# Patient Record
Sex: Male | Born: 1960 | Race: White | Hispanic: No | Marital: Married | Smoking: Current every day smoker
Health system: Southern US, Community
[De-identification: ages and names within clinical notes are randomized; demographics above are authoritative.]

## PROBLEM LIST (undated history)

## (undated) DIAGNOSIS — R5382 Chronic fatigue, unspecified: Secondary | ICD-10-CM

## (undated) DIAGNOSIS — M797 Fibromyalgia: Secondary | ICD-10-CM

## (undated) DIAGNOSIS — M545 Low back pain, unspecified: Secondary | ICD-10-CM

## (undated) DIAGNOSIS — G9332 Myalgic encephalomyelitis/chronic fatigue syndrome: Secondary | ICD-10-CM

## (undated) DIAGNOSIS — F900 Attention-deficit hyperactivity disorder, predominantly inattentive type: Secondary | ICD-10-CM

## (undated) HISTORY — DX: Chronic fatigue, unspecified: R53.82

## (undated) HISTORY — DX: Attention-deficit hyperactivity disorder, predominantly inattentive type: F90.0

## (undated) HISTORY — DX: Low back pain: M54.5

## (undated) HISTORY — PX: HAND SURGERY: SHX662

## (undated) HISTORY — DX: Fibromyalgia: M79.7

## (undated) HISTORY — DX: Myalgic encephalomyelitis/chronic fatigue syndrome: G93.32

## (undated) HISTORY — DX: Low back pain, unspecified: M54.50

---

## 2008-10-17 ENCOUNTER — Ambulatory Visit: Payer: Self-pay | Admitting: Specialist

## 2009-12-19 ENCOUNTER — Ambulatory Visit: Payer: Self-pay | Admitting: Pain Medicine

## 2010-01-01 ENCOUNTER — Ambulatory Visit: Payer: Self-pay | Admitting: Pain Medicine

## 2012-08-09 ENCOUNTER — Ambulatory Visit: Payer: Self-pay

## 2014-08-29 ENCOUNTER — Telehealth: Payer: Self-pay | Admitting: Family Medicine

## 2014-08-29 DIAGNOSIS — F900 Attention-deficit hyperactivity disorder, predominantly inattentive type: Secondary | ICD-10-CM

## 2014-08-29 DIAGNOSIS — M797 Fibromyalgia: Secondary | ICD-10-CM

## 2014-08-29 NOTE — Telephone Encounter (Signed)
PT NEEDS REF ON ALL HIS MEDS. HE COULD NOT GET AN APPT TILL July 11 BUT WILL BE OUT OF ALL MEDS IN A FEW DAYS. PHARM IS [facility] ON [address]

## 2014-08-31 DIAGNOSIS — F900 Attention-deficit hyperactivity disorder, predominantly inattentive type: Secondary | ICD-10-CM | POA: Insufficient documentation

## 2014-08-31 DIAGNOSIS — M797 Fibromyalgia: Secondary | ICD-10-CM | POA: Insufficient documentation

## 2014-08-31 MED ORDER — PREGABALIN 150 MG PO CAPS: 150.0000 mg | ORAL_CAPSULE | Freq: Every morning | ORAL | Status: DC

## 2014-08-31 MED ORDER — PREGABALIN 225 MG PO CAPS: 225.0000 mg | ORAL_CAPSULE | Freq: Every day | ORAL | Status: DC

## 2014-08-31 MED ORDER — DULOXETINE HCL 60 MG PO CPEP: 60.0000 mg | ORAL_CAPSULE | Freq: Every day | ORAL | Status: DC

## 2014-08-31 MED ORDER — BUPROPION HCL ER (XL) 300 MG PO TB24: 300.0000 mg | ORAL_TABLET | Freq: Every day | ORAL | Status: DC

## 2014-08-31 MED ORDER — AMPHETAMINE-DEXTROAMPHETAMINE 20 MG PO TABS: 20.0000 mg | ORAL_TABLET | Freq: Three times a day (TID) | ORAL | Status: DC

## 2014-08-31 NOTE — Telephone Encounter (Signed)
Do you want to refill these?

## 2014-08-31 NOTE — Telephone Encounter (Signed)
Patient has appointment on July 11. We will prescribe a 10 day supply of all his medications and follow-up at his office visit appointment

## 2014-09-10 ENCOUNTER — Ambulatory Visit: Payer: BLUE CROSS/BLUE SHIELD | Admitting: Family Medicine

## 2014-09-17 ENCOUNTER — Encounter: Payer: Self-pay | Admitting: Family Medicine

## 2014-09-17 ENCOUNTER — Ambulatory Visit (INDEPENDENT_AMBULATORY_CARE_PROVIDER_SITE_OTHER): Payer: BLUE CROSS/BLUE SHIELD | Admitting: Family Medicine

## 2014-09-17 VITALS — BP 132/70 | HR 75 | Temp 97.9°F | Resp 17 | Ht 64.0 in | Wt 169.2 lb

## 2014-09-17 DIAGNOSIS — M797 Fibromyalgia: Secondary | ICD-10-CM | POA: Insufficient documentation

## 2014-09-17 DIAGNOSIS — F900 Attention-deficit hyperactivity disorder, predominantly inattentive type: Secondary | ICD-10-CM

## 2014-09-17 DIAGNOSIS — G9332 Myalgic encephalomyelitis/chronic fatigue syndrome: Secondary | ICD-10-CM | POA: Insufficient documentation

## 2014-09-17 DIAGNOSIS — R5382 Chronic fatigue, unspecified: Secondary | ICD-10-CM

## 2014-09-17 MED ORDER — AMPHETAMINE-DEXTROAMPHETAMINE 20 MG PO TABS: 20.0000 mg | ORAL_TABLET | Freq: Three times a day (TID) | ORAL | Status: DC

## 2014-09-17 MED ORDER — DULOXETINE HCL 60 MG PO CPEP: 60.0000 mg | ORAL_CAPSULE | Freq: Every day | ORAL | Status: AC

## 2014-09-17 MED ORDER — PREGABALIN 150 MG PO CAPS: 150.0000 mg | ORAL_CAPSULE | Freq: Every morning | ORAL | Status: AC

## 2014-09-17 MED ORDER — BUPROPION HCL ER (XL) 300 MG PO TB24: 300.0000 mg | ORAL_TABLET | Freq: Every day | ORAL | Status: AC

## 2014-09-17 MED ORDER — PREGABALIN 225 MG PO CAPS: 225.0000 mg | ORAL_CAPSULE | Freq: Every day | ORAL | Status: AC

## 2014-09-17 NOTE — Progress Notes (Signed)
Name: Miguel PilesWilliam Freeman   MRN: [id number]    DOB: 11-16-1960   Date:09/17/2014       Progress Note  Subjective  Chief Complaint  Chief Complaint  Patient presents with  . Medication Refill    HPI Attention Deficit Disorder Symptoms include difficulty with attention, staying on a task (which often leads to leaving a task unfinished), and forgetfulness. Symptoms relieved with Adderall 20 mg three times daily. No side effects reported.  Fibromyalgia Symptoms include diffuse widespread intense pain along with sharp intermittent localized pains such as pain in ear or toe or anywhere. Symptoms responsive to Cymbalta and Lyrica respectively (widespread and localized pain). In addition, he takes Wellbutrin XL Past Medical History  Diagnosis Date  . Chronic fatigue fibromyalgia syndrome   . ADD (attention deficit hyperactivity disorder, inattentive type)   . Low back pain     Past Surgical History  Procedure Laterality Date  . Hand surgery Right     Family History  Problem Relation Age of Onset  . Cancer Mother   . Cancer Father   . Cancer Sister     History   Social History  . Marital Status: Married    Spouse Name: N/A  . Number of Children: N/A  . Years of Education: N/A   Occupational History  . Not on file.   Social History Main Topics  . Smoking status: Current Every Day Smoker -- 0.00 packs/day for 30 years    Types: Pipe  . Smokeless tobacco: Never Used  . Alcohol Use: 0.0 oz/week    0 Standard drinks or equivalent per week     Comment: occasional  . Drug Use: No  . Sexual Activity: Not on file   Other Topics Concern  . Not on file   Social History Narrative  . No narrative on file     Current outpatient prescriptions:  .  amphetamine-dextroamphetamine (ADDERALL) 20 MG tablet, Take 1 tablet (20 mg total) by mouth 3 (three) times daily., Disp: 30 tablet, Rfl: 0 .  buPROPion (WELLBUTRIN XL) 300 MG 24 hr tablet, Take 1 tablet (300 mg total) by mouth  daily., Disp: 10 tablet, Rfl: 0 .  DULoxetine (CYMBALTA) 60 MG capsule, Take 1 capsule (60 mg total) by mouth daily., Disp: 10 capsule, Rfl: 0 .  pregabalin (LYRICA) 150 MG capsule, Take 1 capsule (150 mg total) by mouth every morning., Disp: 10 capsule, Rfl: 0 .  pregabalin (LYRICA) 225 MG capsule, Take 1 capsule (225 mg total) by mouth at bedtime., Disp: 10 capsule, Rfl: 0  No Known Allergies   Review of Systems  Musculoskeletal: Positive for myalgias and back pain.  Psychiatric/Behavioral: Negative for depression. The patient is not nervous/anxious and does not have insomnia.       Objective  Filed Vitals:   09/17/14 1619  BP: 132/70  Pulse: 75  Temp: 97.9 F (36.6 C)  TempSrc: Oral  Resp: 17  Height: 5\' 4"  (1.626 m)  Weight: 169 lb 3.2 oz (76.749 kg)  SpO2: 97%    Physical Exam  Constitutional: He is oriented to person, place, and time and well-developed, well-nourished, and in no distress.  Cardiovascular: Normal rate and regular rhythm.   Pulmonary/Chest: Effort normal and breath sounds normal.  Abdominal: Soft. Bowel sounds are normal.  Neurological: He is alert and oriented to person, place, and time.  Skin: Skin is warm and dry.  Nursing note and vitals reviewed.   Assessment & Plan 1. Fibromyalgia syndrome Symptoms of chronic diffuse  pain are stable and responsive to therapy. No adverse effects from medications reported. Continue present management - buPROPion (WELLBUTRIN XL) 300 MG 24 hr tablet; Take 1 tablet (300 mg total) by mouth daily.  Dispense: 30 tablet; Refill: 0 - DULoxetine (CYMBALTA) 60 MG capsule; Take 1 capsule (60 mg total) by mouth daily.  Dispense: 30 capsule; Refill: 0 - pregabalin (LYRICA) 150 MG capsule; Take 1 capsule (150 mg total) by mouth every morning.  Dispense: 30 capsule; Refill: 0 - pregabalin (LYRICA) 225 MG capsule; Take 1 capsule (225 mg total) by mouth at bedtime.  Dispense: 10 capsule; Refill: 0  2. Attention deficit  hyperactivity disorder, inattentive type, severe Symptoms of attention deficit disorder are stable on stimulant therapy. No adverse effects reported. Follow-up in one month. - amphetamine-dextroamphetamine (ADDERALL) 20 MG tablet; Take 1 tablet (20 mg total) by mouth 3 (three) times daily.  Dispense: 30 tablet; Refill: 0  3. Chronic fatigue fibromyalgia syndrome    Emberlynn Riggan Asad [facility] 09/17/2014 4:43 PM

## 2014-09-28 ENCOUNTER — Other Ambulatory Visit: Payer: Self-pay | Admitting: Family Medicine

## 2014-09-28 ENCOUNTER — Telehealth: Payer: Self-pay | Admitting: Family Medicine

## 2014-09-28 DIAGNOSIS — F900 Attention-deficit hyperactivity disorder, predominantly inattentive type: Secondary | ICD-10-CM

## 2014-09-28 MED ORDER — AMPHETAMINE-DEXTROAMPHETAMINE 20 MG PO TABS: 20.0000 mg | ORAL_TABLET | Freq: Three times a day (TID) | ORAL | Status: AC

## 2014-09-28 NOTE — Telephone Encounter (Signed)
Patient medication was refilled on 09/17/2014.

## 2014-09-28 NOTE — Telephone Encounter (Signed)
Pt needs refill on [facility].  °

## 2014-09-29 IMAGING — CR DG KNEE 1-2V*R*
1 series · 2 of 2 positions shown · non-contrast
Comparison: none

REASON FOR EXAM: ddd,chronic pain right knee
COMMENTS:

PROCEDURE:     DXR - DXR KNEE RIGHT AP AND LATERAL  - August 09, 2012  [DATE]
RESULT:     Comparison:  None

[Series 1: t knee ap right · 0.14mm/px · 2 of 2 slices shown]
[im 1/2]
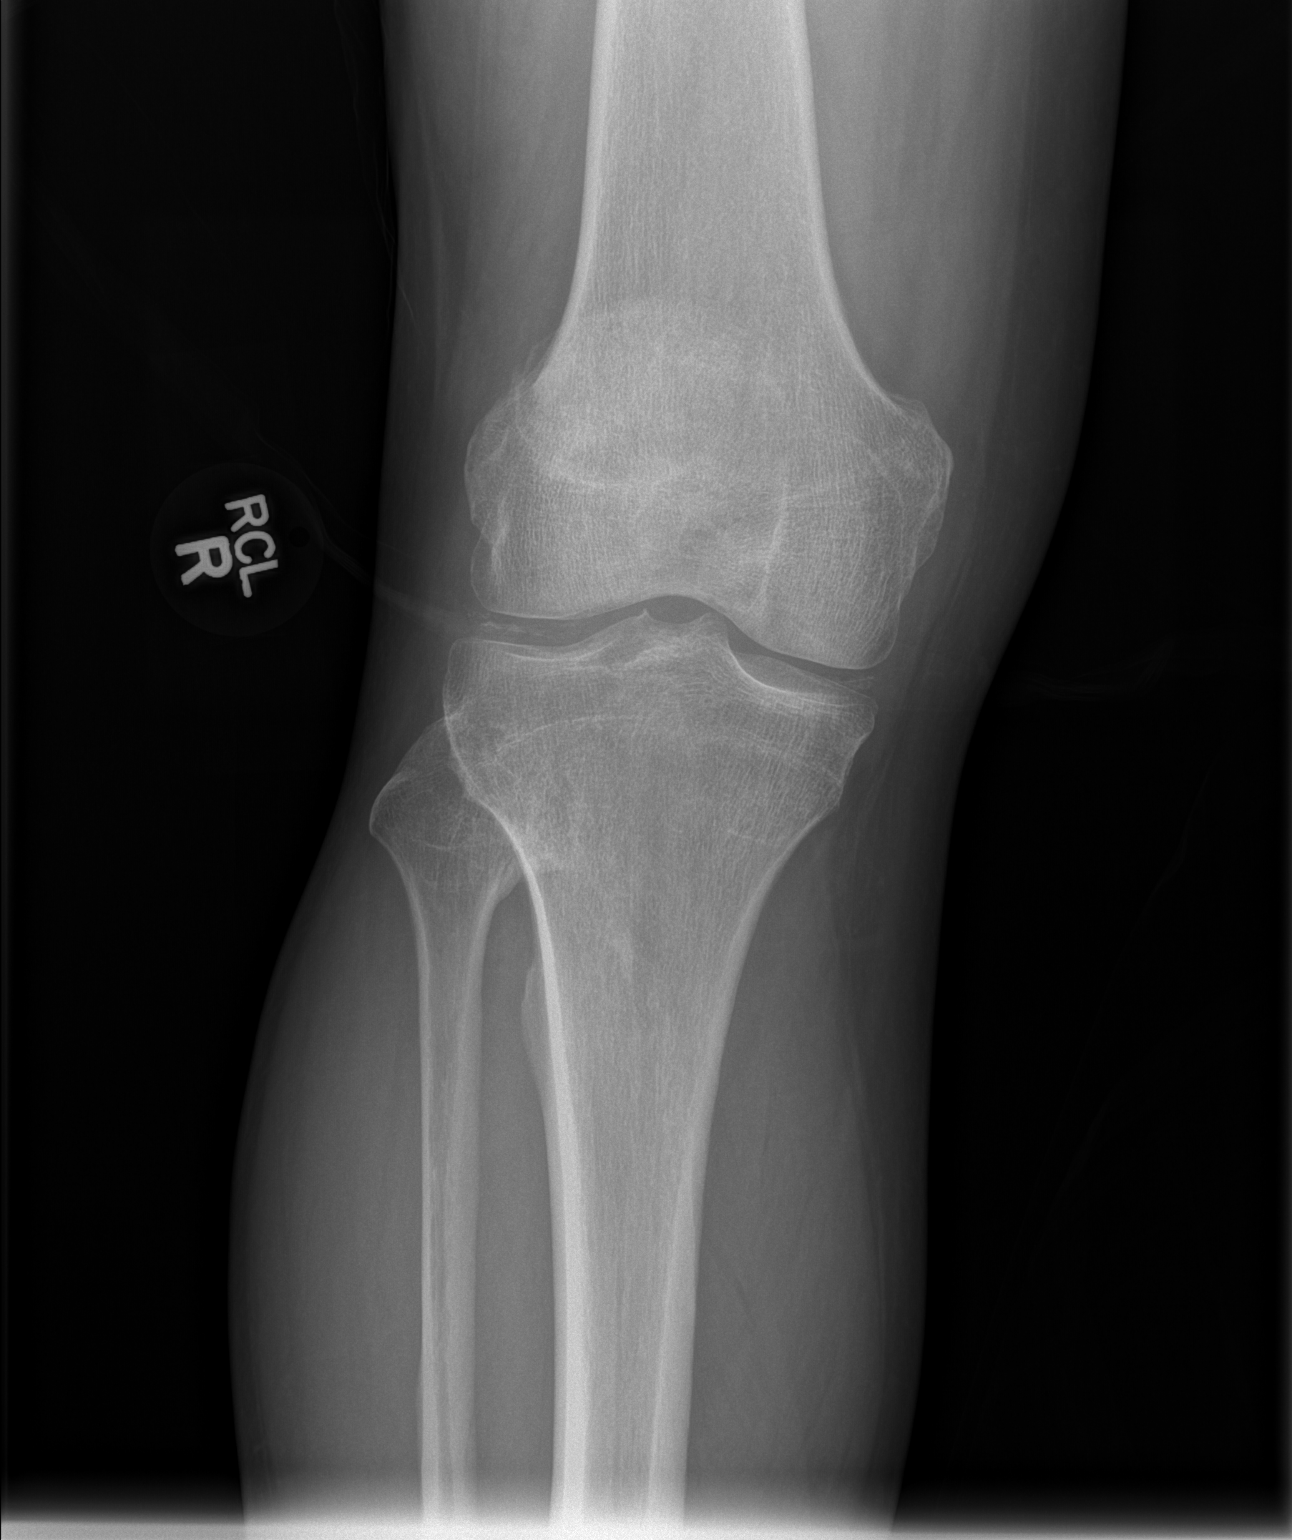
[im 2/2]
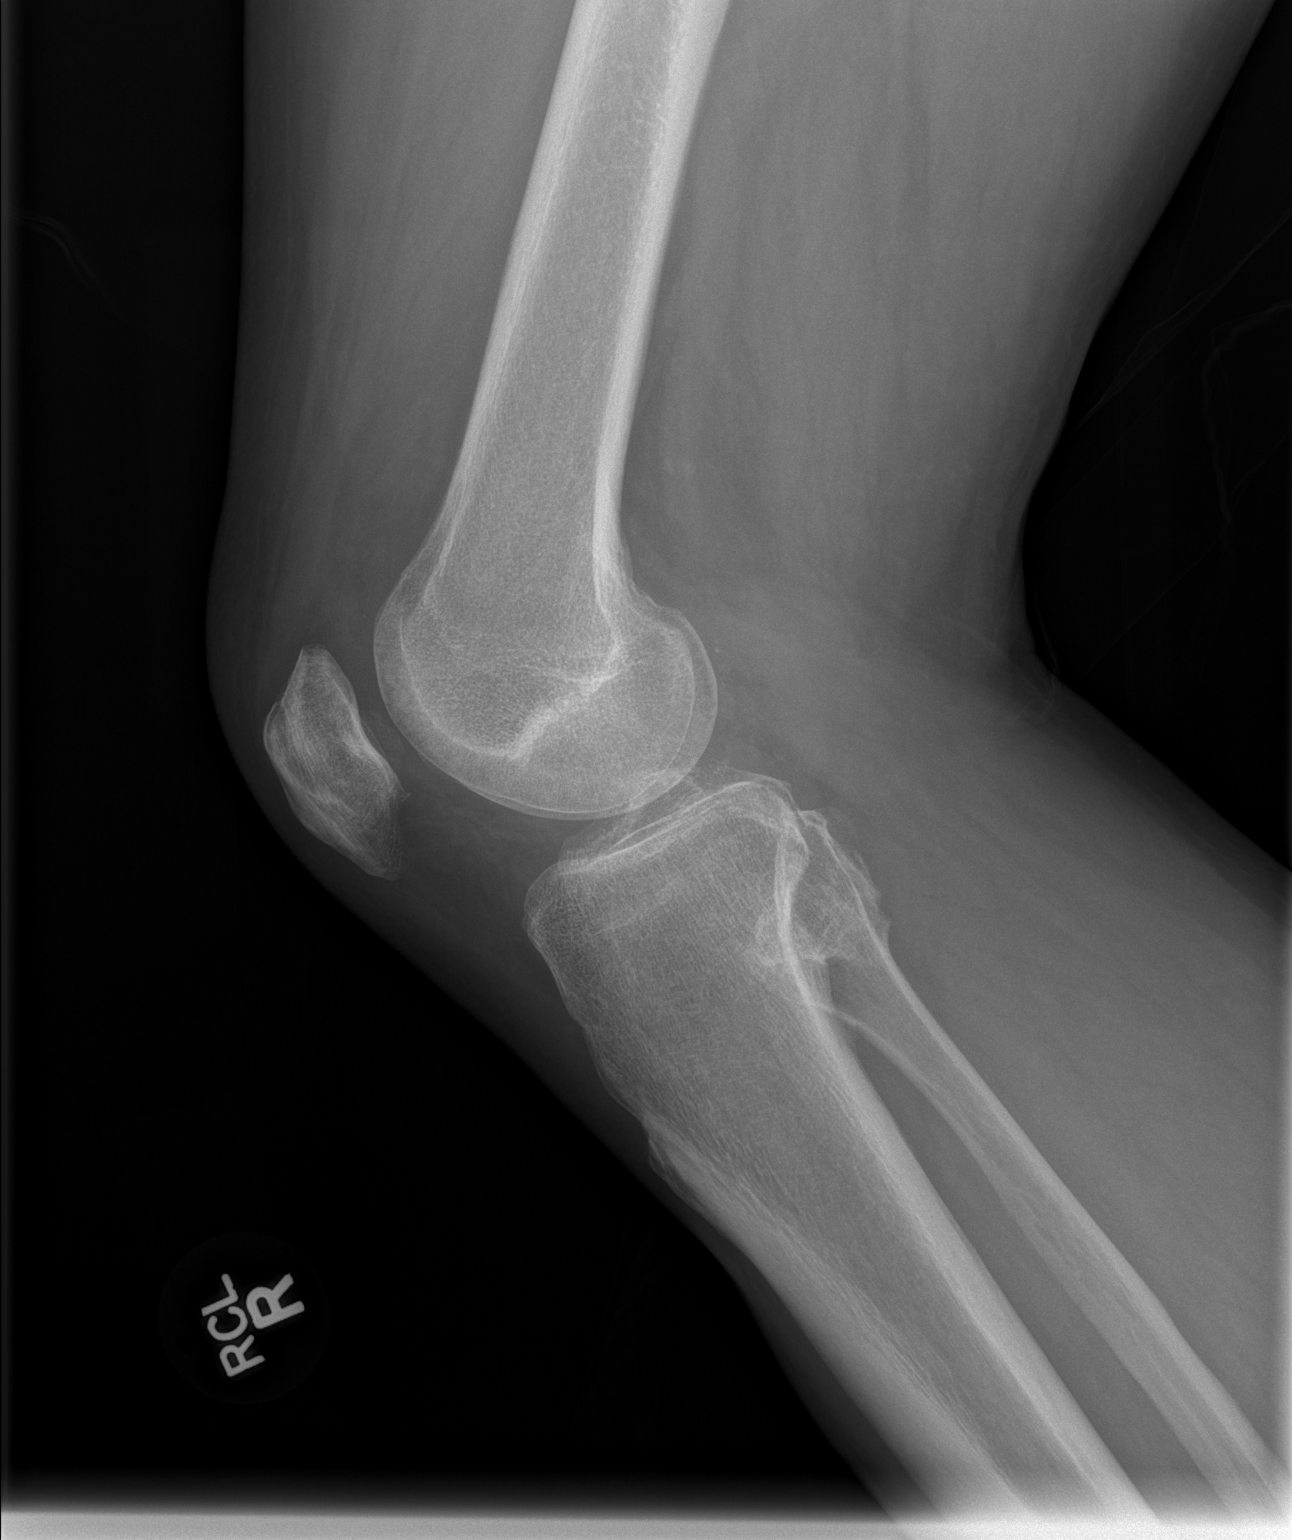

[2 of 2 positions shown; findings below may reference images not displayed]

FINDINGS: AP and lateral views of the right knee demonstrates no acute fracture or
dislocation. There is no significant joint effusion. There is
chondrocalcinosis of the medial and lateral tibiofemoral compartments. The
joint spaces are maintained.
IMPRESSION: No acute osseous injury of the right knee.

[REDACTED]

## 2014-10-15 ENCOUNTER — Ambulatory Visit: Payer: BLUE CROSS/BLUE SHIELD | Admitting: Family Medicine
